# Patient Record
Sex: Female | Born: 1975 | Hispanic: Yes | Marital: Single | Smoking: Never smoker
Health system: Southern US, Community
[De-identification: ages and names within clinical notes are randomized; demographics above are authoritative.]

## PROBLEM LIST (undated history)

## (undated) DIAGNOSIS — D649 Anemia, unspecified: Secondary | ICD-10-CM

## (undated) DIAGNOSIS — E119 Type 2 diabetes mellitus without complications: Secondary | ICD-10-CM

## (undated) HISTORY — DX: Type 2 diabetes mellitus without complications: E11.9

## (undated) HISTORY — DX: Anemia, unspecified: D64.9

---

## 2009-10-24 ENCOUNTER — Emergency Department: Payer: Self-pay | Admitting: Emergency Medicine

## 2010-07-21 ENCOUNTER — Emergency Department: Payer: Self-pay | Admitting: Emergency Medicine

## 2012-05-28 IMAGING — US US OB < 14 WEEKS - US OB TV
1 series · 17 of 28 positions shown · non-contrast
Comparison: none

REASON FOR EXAM: vaginal bleeding pregnant
COMMENTS:   May transport without cardiac monitor

[Series 1: us ob < 14 weeks - us ob tv · 17 of 59 slices shown]
[im 1/59]
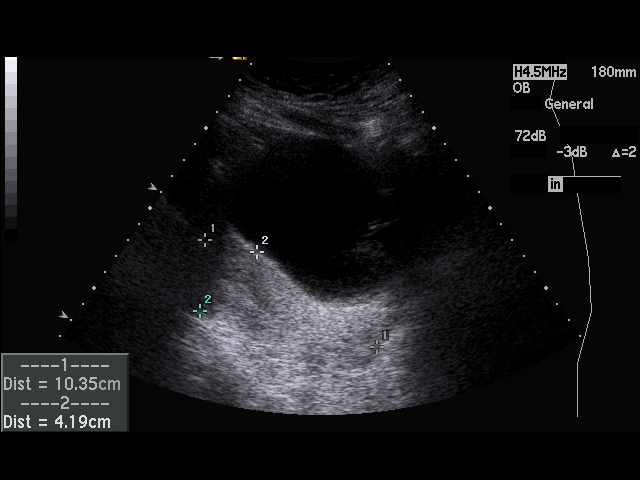
[im 5/59]
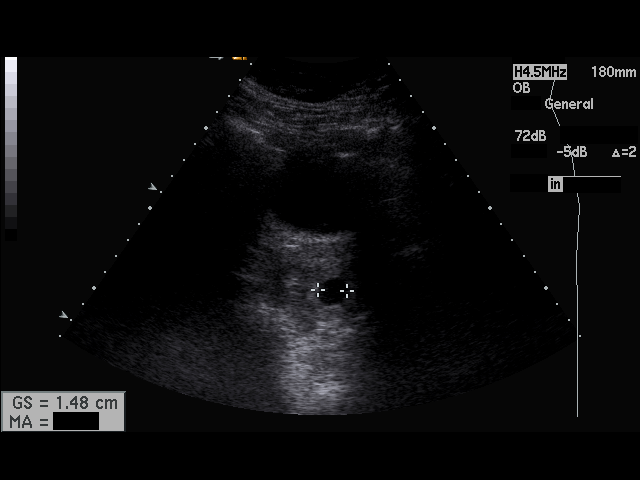
[im 9/59]
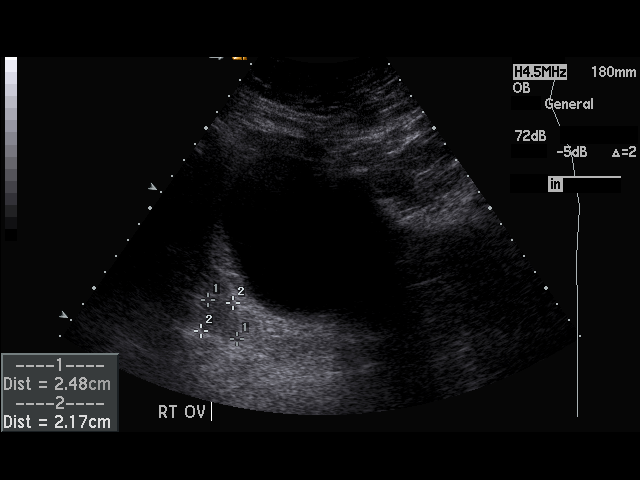
[im 11/59]
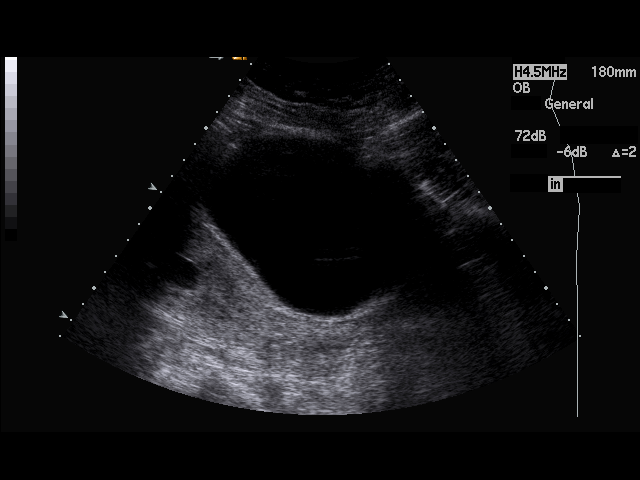
[im 16/59]
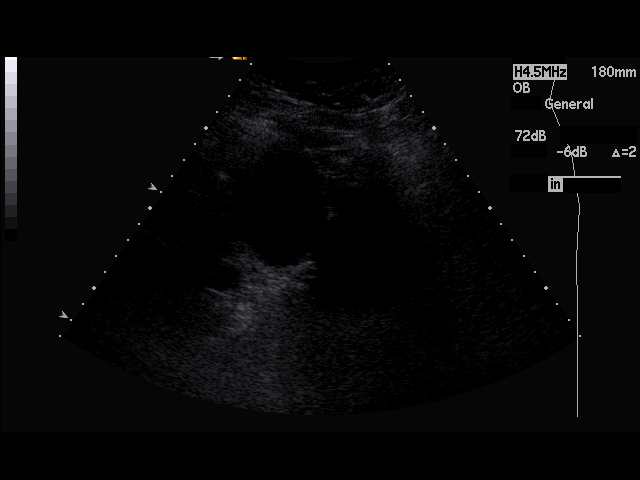
[im 20/59]
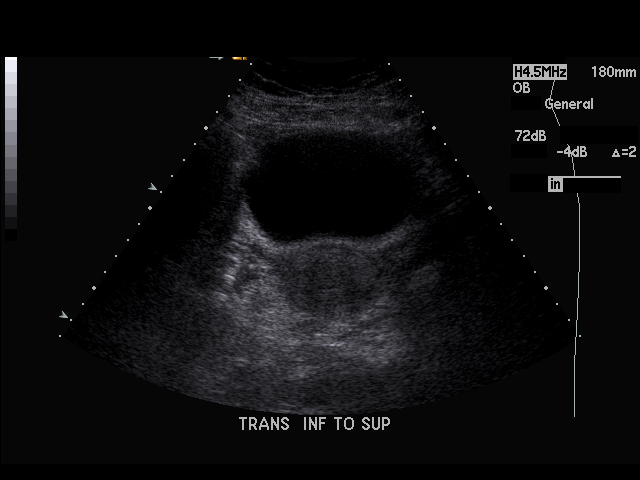
[im 22/59]
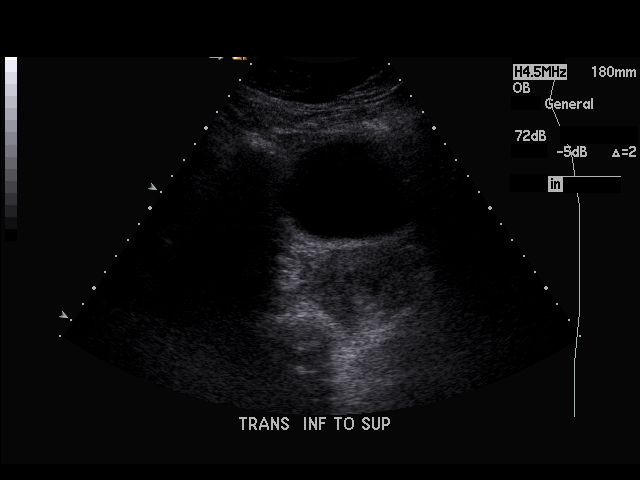
[im 26/59]
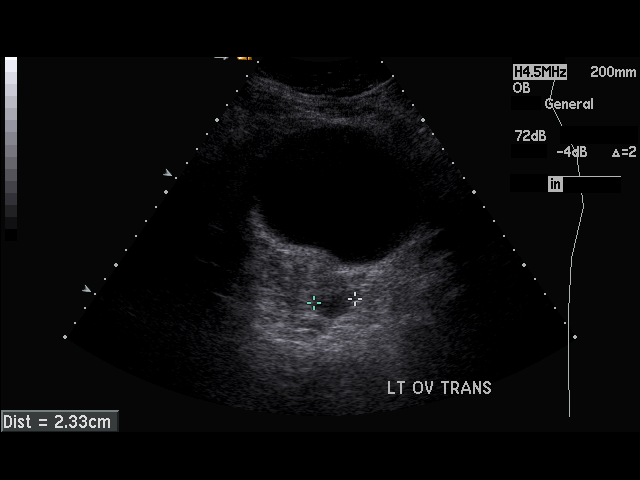
[im 31/59]
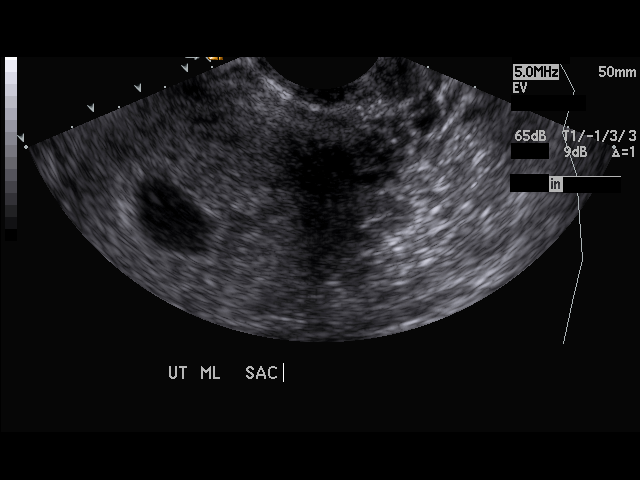
[im 33/59]
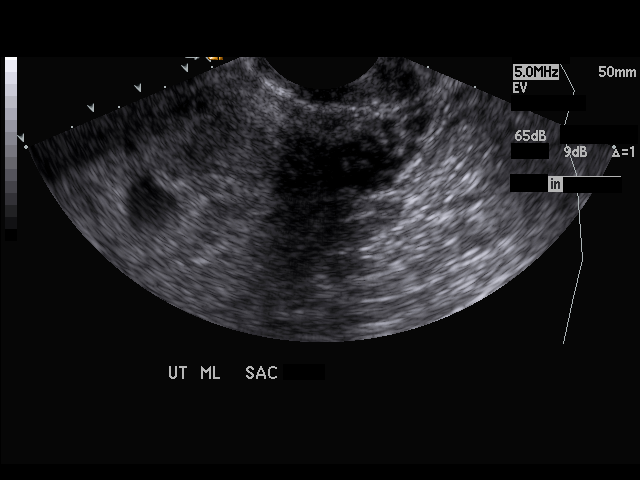
[im 37/59]
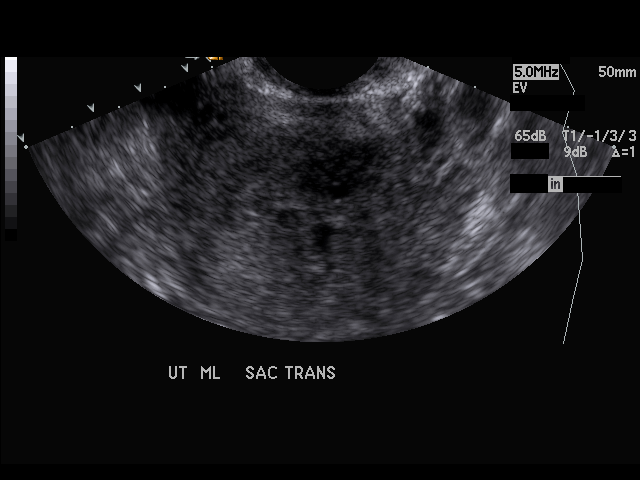
[im 39/59]
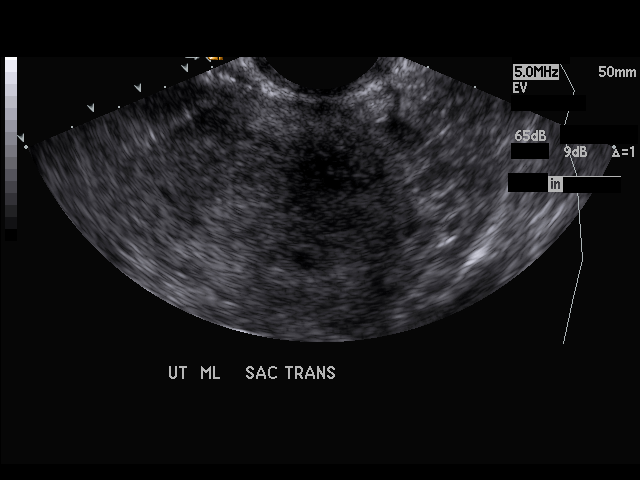
[im 43/59]
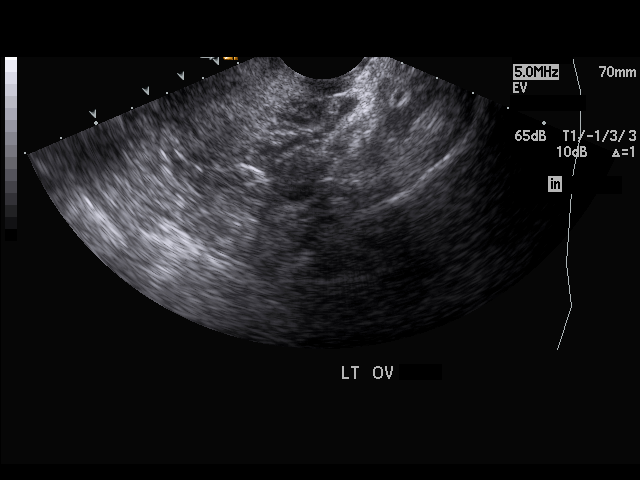
[im 48/59]
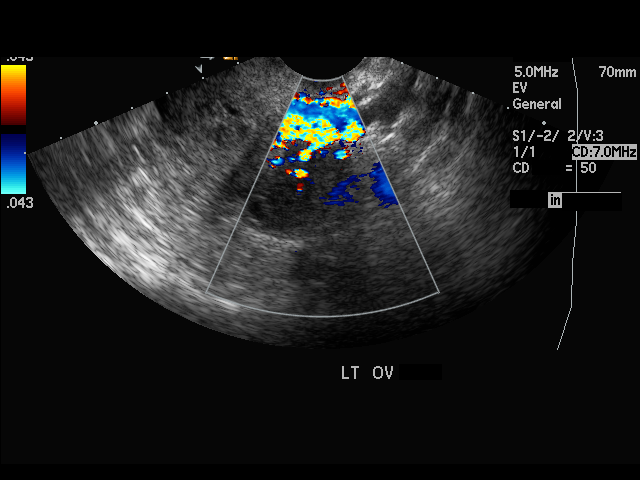
[im 50/59]
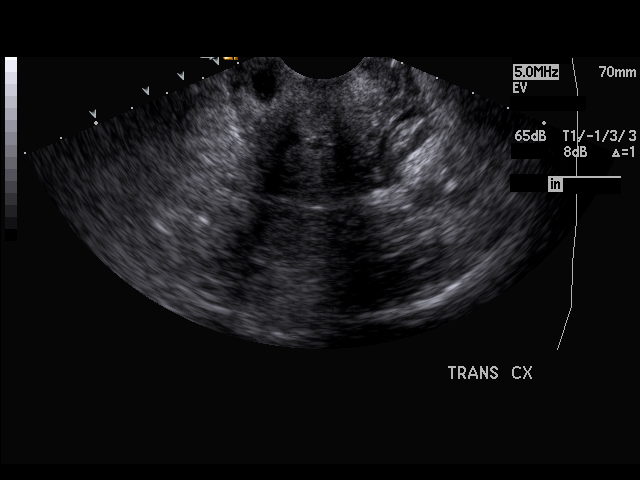
[im 54/59]
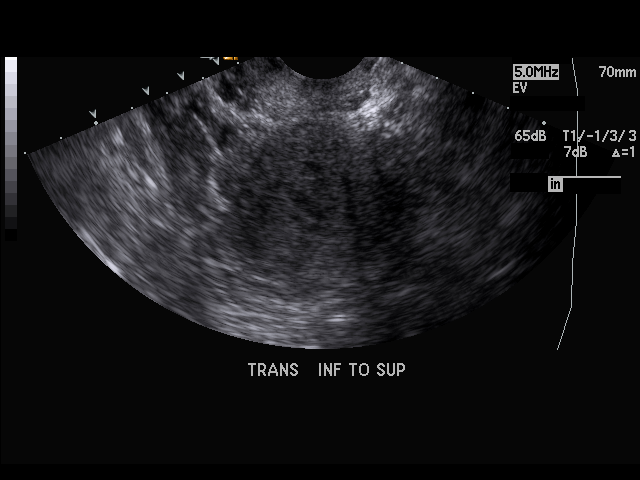
[im 59/59]
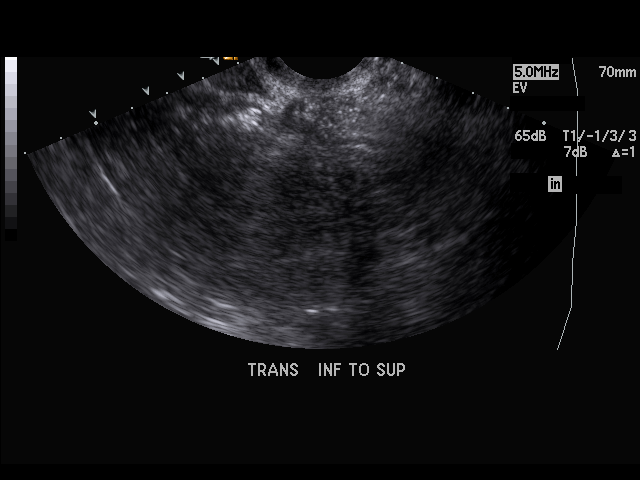

[17 of 28 positions shown; findings below may reference images not displayed]

PROCEDURE:     US  - US OB LESS THAN 14 WEEKS/W TRANS  - October 24, 2009 [DATE]

RESULT:     Emergent transabdominal and endovaginal pelvic sonogram is
performed. There is no previous study for comparison.

The uterus measures 10.35 x 4.19 cm due to transverse measurement was not
obtained. Both ovaries are seen. There is an intrauterine fluid collection
suggestive of a gestational sac. Gestational sac diameter is 1.48 cm
consistent with a 5 week 5 day gestation. No fetal pole is evident. This
could be secondary to being an early intrauterine gestation. A pseudosac
from an ectopic gestation or a nonviable gestation also considerations.
Routine followup is recommended with serial quantitative beta hCGs and
ultrasound as necessary. There is no free fluid or extrauterine abnormal
fluid collection. A Foley catheter is seen in the urinary bladder. The
ovaries appear unremarkable in size and echotexture.
IMPRESSION: 1. Intrauterine fluid collection as discussed above. Followup with serial
quantitative beta-hCG is recommended. No definite ectopic fluid collection
is identified but followup is required to exclude pseudogestational sac or
nonviable gestation. An early normal intrauterine gestation without a
visible fetal pole is also a consideration.

## 2013-02-22 IMAGING — US US PELV - US TRANSVAGINAL
1 series · 14 of 25 positions shown · non-contrast
Comparison: none

REASON FOR EXAM: vaginal bleeding, R adnexal tenderness
COMMENTS:

[Series 1: us pelv - us transvaginal · 0.39mm/px · 14 of 70 slices shown]
[im 1/70]
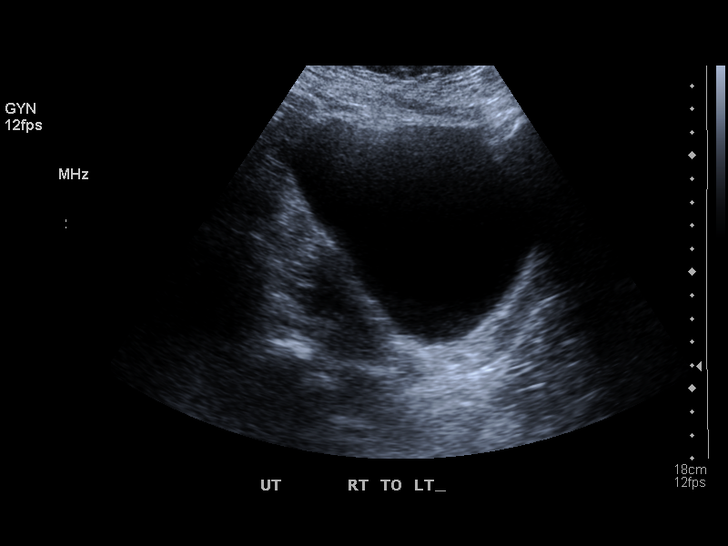
[im 6/70]
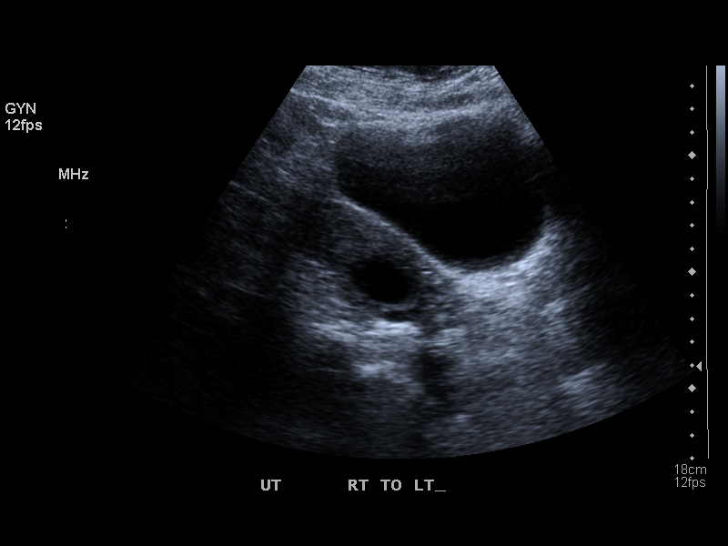
[im 12/70]
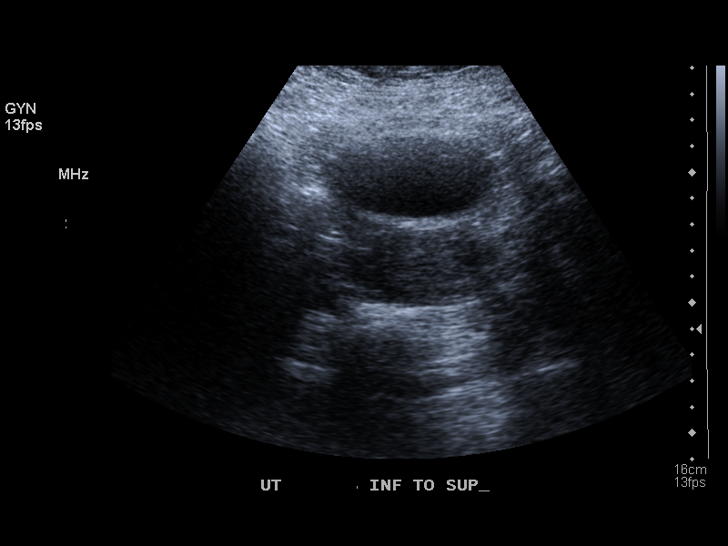
[im 18/70]
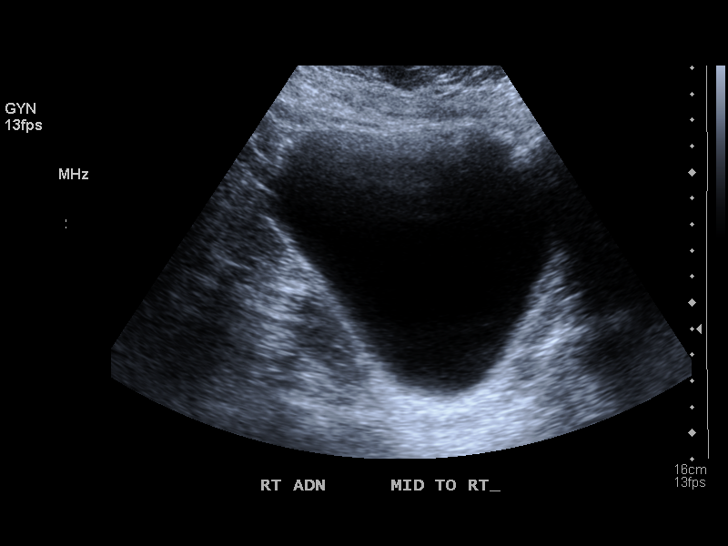
[im 24/70]
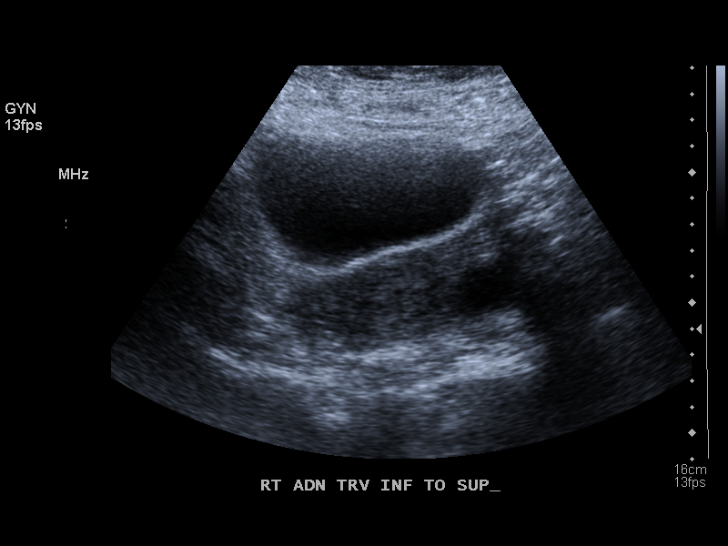
[im 26/70]
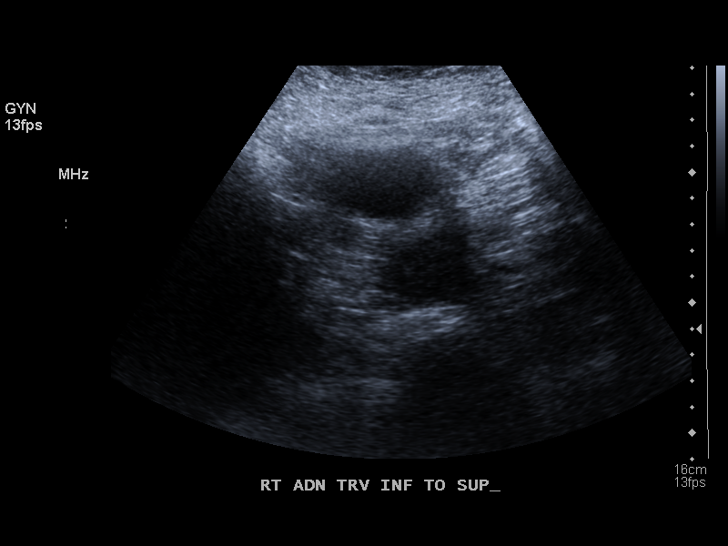
[im 32/70]
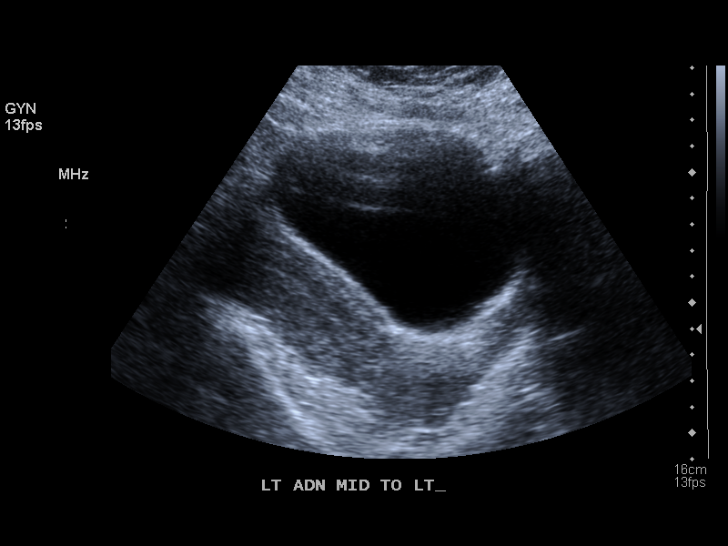
[im 38/70]
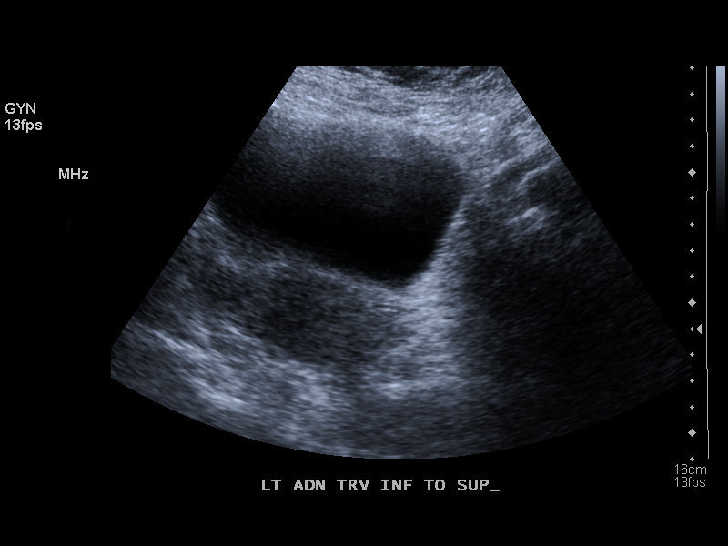
[im 44/70]
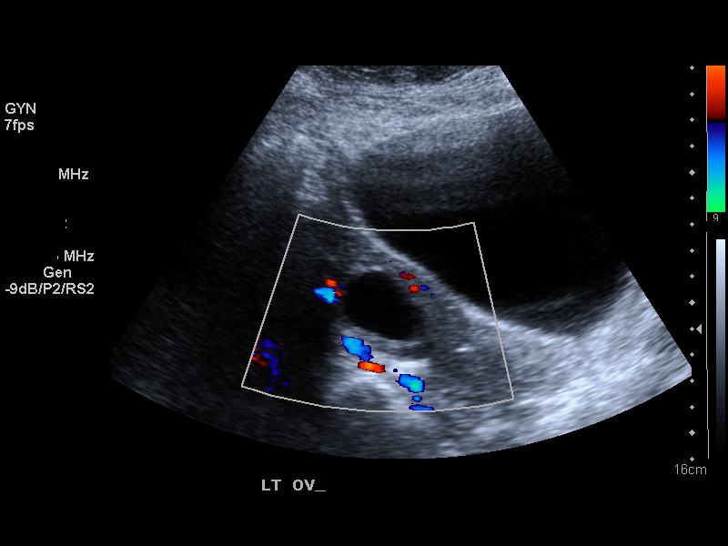
[im 47/70]
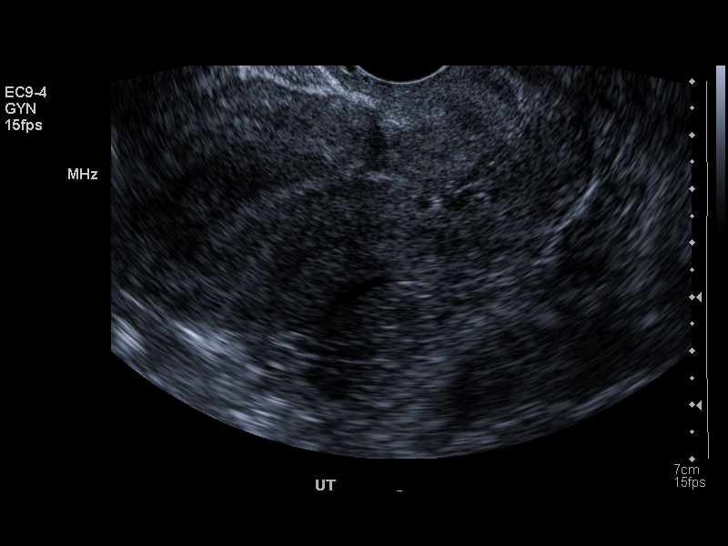
[im 52/70]
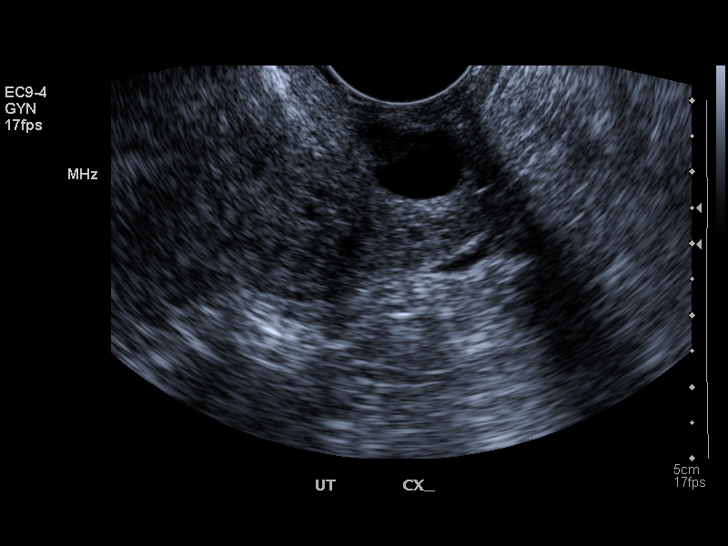
[im 58/70]
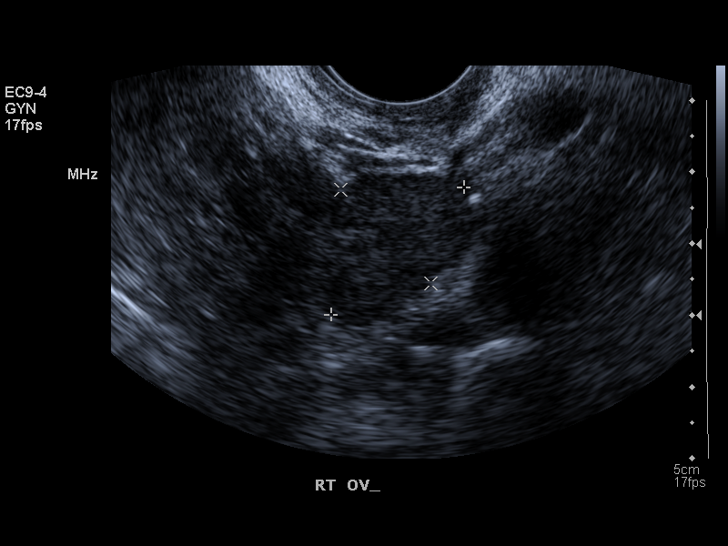
[im 64/70]
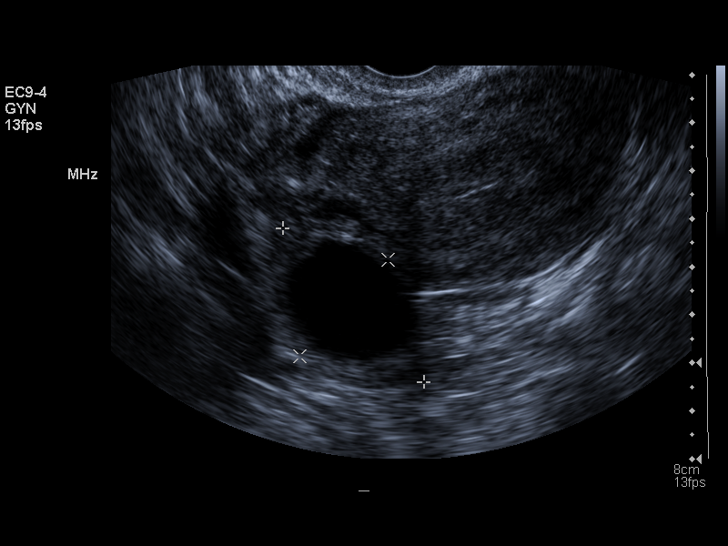
[im 70/70]
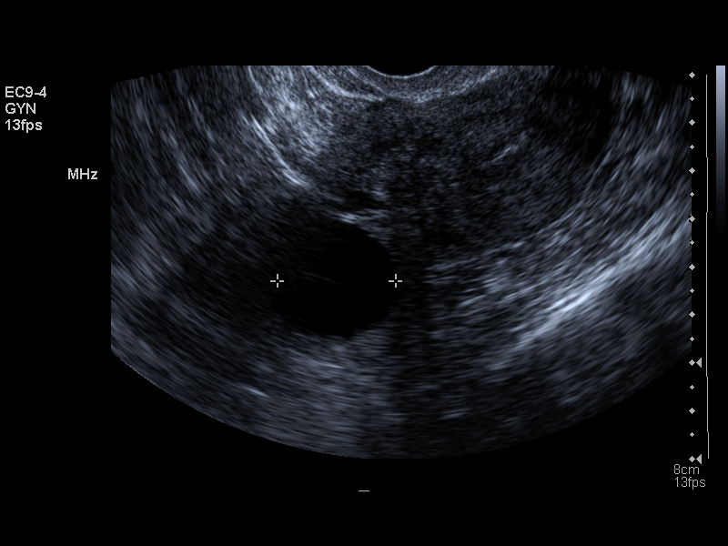

[14 of 25 positions shown; findings below may reference images not displayed]

PROCEDURE:     US  - US PELVIS EXAM W/TRANSVAGINAL  - July 21, 2010  [DATE]

RESULT:     Endovaginal and transabdominal pelvic sonogram demonstrates the
uterus is in the midline and measures 5.07 x 11.55 x 3.4 cm. Endovaginal
scanning is performed and demonstrates an endometrial stripe thickness of
6.6 mm. The adnexal structures appear to be unremarkable. The left ovary
contains a 2.7 x 2.2 x 2.5 cm cyst. A Nabothian cyst is seen measuring up to
1.35 cm. The right ovary is unremarkable.
IMPRESSION: 1.     Nabothian cyst and left ovarian cyst. Otherwise, grossly normal
study. There is a thin amount of fluid in the endometrium which is of
doubtful clinical significance.

## 2016-01-22 DIAGNOSIS — R899 Unspecified abnormal finding in specimens from other organs, systems and tissues: Secondary | ICD-10-CM | POA: Insufficient documentation

## 2016-08-29 LAB — HM HIV SCREENING LAB: HM HIV Screening: NEGATIVE

## 2016-08-29 LAB — HM PAP SMEAR: HM Pap smear: NEGATIVE

## 2019-03-26 ENCOUNTER — Other Ambulatory Visit: Payer: Self-pay

## 2019-03-26 ENCOUNTER — Encounter: Payer: Self-pay | Admitting: Emergency Medicine

## 2019-03-26 ENCOUNTER — Ambulatory Visit
Admission: EM | Admit: 2019-03-26 | Discharge: 2019-03-26 | Disposition: A | Discharge status: Home / Self Care | Payer: Commercial Managed Care - PPO | Attending: Urgent Care | Admitting: Urgent Care

## 2019-03-26 DIAGNOSIS — R0602 Shortness of breath: Secondary | ICD-10-CM | POA: Diagnosis not present

## 2019-03-26 DIAGNOSIS — R05 Cough: Secondary | ICD-10-CM

## 2019-03-26 DIAGNOSIS — Z20822 Contact with and (suspected) exposure to covid-19: Secondary | ICD-10-CM | POA: Diagnosis not present

## 2019-03-26 DIAGNOSIS — U071 COVID-19: Secondary | ICD-10-CM | POA: Insufficient documentation

## 2019-03-26 HISTORY — DX: COVID-19: U07.1

## 2019-03-26 MED ORDER — HYDROCOD POLST-CPM POLST ER 10-8 MG/5ML PO SUER: 5.0000 mL | Freq: Two times a day (BID) | ORAL | 0 refills | Status: AC | PRN

## 2019-03-26 NOTE — ED Triage Notes (Signed)
Patient triaged with video spanish interpreter Christiane Ha [phone]. Patient in today c/o cough and sob with coughing x 1 day. Patient does state that she had a headache and body aches.

## 2019-03-26 NOTE — Discharge Instructions (Addendum)
It was very nice seeing you today in clinic. Thank you for entrusting me with your care.  Rest and stay HYDRATED. Water, Gatorade/Pedialyte are best to prevent dehydration Recommend warm salt water gargles, hard candies/lozenges, and hot tea with honey/lemon to help soothe the throat and reduce irritation.  May use Tylenol and/or Ibuprofen as needed for pain/fever.  Cough medication as prescribed. Your prescription has been called in to your pharmacy.  You were tested for SARS-CoV-2 (novel coronavirus) today. Testing is performed by an outside lab ([facility]) and has variable turn around times ranging between 2-5 days. Current recommendations from the the CDC and [place] DHHS require that you remain out of work in order to quarantine at home until negative test results are have been received. In the event that your test results are positive, you will be contacted with further directives. These measures are being implemented out of an abundance of caution to prevent transmission and spread during the current SARS-CoV-2 pandemic.  Make arrangements to follow up with your regular doctor in 1 week for re-evaluation if not improving. If your symptoms/condition worsens, please seek follow up care either here or in the ER. Please remember, our [facility] providers are "right here with you" when you need [place].   Again, it was my pleasure to take care of you today. Thank you for choosing our clinic. I hope that you start to feel better quickly.   Quentin Mulling, MSN, APRN, FNP-C, CEN Advanced Practice Provider [facility]

## 2019-03-27 ENCOUNTER — Encounter: Payer: Self-pay | Admitting: Urgent Care

## 2019-03-27 NOTE — ED Provider Notes (Signed)
[place], [place]   Name: Alicia Edwards DOB: April 09, 1975 MRN: [id number] CSN: [id number] PCP: Patient, No Pcp Per  Arrival date and time:  03/26/19 1326  Chief Complaint:  Cough and Shortness of Breath   NOTE: Prior to seeing the patient today, I have reviewed the triage nursing documentation and vital signs. Clinical staff has updated patient's PMH/PSHx, current medication list, and drug allergies/intolerances to ensure comprehensive history available to assist in medical decision making.   History:    Patient is non-English speaking; speaks Spanish only. Encounter assisted by family member (son) and tele-interpreter Roderic Palau [phone]).   HPI: Alicia Edwards is a 44 y.o. female who presents today with complaints of fatigue, cough, congestion, shortness of breath, sore throat, diffuse myalgias, and a generalized headache that started approximately yesterday. Patient denies fevers. Cough has been non-productive. She complains of it being worse at night. Denies wheezing. Cough ahs been forceful causing her to experience pleuritic chest pain with coughing and deep inspiration. She denies that she has experienced any nausea, vomiting, diarrhea, or abdominal pain. She is eating and drinking well. Patient confirms perceived alterations to her sense of taste and smell; ageusia and anosmia. Patient denies being in close contact with anyone known to be ill; no one else is her home has experienced a similar symptom constellation. She has never been tested for SARS-CoV-2 (novel coronavirus) in the past per her report. Patient has not been vaccinated for influenza this season. In efforts to conservatively manage her symptoms at home, the patient notes that she has used APAP, which has not helped to improve her symptoms.  History reviewed. No pertinent past medical history.  Past Surgical History:  Procedure Laterality Date  . CESAREAN SECTION      Family History  Problem  Relation Age of Onset  . Diabetes Mother   . Diabetes Father     Social History   Tobacco Use  . Smoking status: Never Smoker  . Smokeless tobacco: Never Used  Substance Use Topics  . Alcohol use: Never  . Drug use: Never    There are no problems to display for this patient.   Home Medications:    No outpatient medications have been marked as taking for the 03/26/19 encounter [facility] Encounter).    Allergies:   Patient has no known allergies.  Review of Systems (ROS): Review of Systems  Constitutional: Positive for fatigue. Negative for fever.  HENT: Positive for postnasal drip and sore throat. Negative for congestion, ear pain, rhinorrhea, sinus pressure, sinus pain and sneezing.        (+) anosmia and ageusia.  Eyes: Negative for pain, discharge and redness.  Respiratory: Positive for cough and shortness of breath. Negative for chest tightness.   Cardiovascular: Positive for chest pain (pleuritic). Negative for palpitations.  Gastrointestinal: Negative for abdominal pain, diarrhea, nausea and vomiting.  Musculoskeletal: Positive for myalgias. Negative for arthralgias, back pain and neck pain.  Skin: Negative for color change, pallor and rash.  Neurological: Positive for headaches. Negative for dizziness, syncope and weakness.  Hematological: Negative for adenopathy.  Psychiatric/Behavioral: Positive for sleep disturbance (2/2 cough).     Vital Signs: Today's Vitals   03/26/19 1338 03/26/19 1339 03/26/19 1421  BP: 135/86    Pulse: 81    Resp: 18    Temp: 98.7 F (37.1 C)    TempSrc: Oral    SpO2: 100%    Weight:  216 lb (98 kg)   Height:  5\' 3"  (1.6 m)  PainSc:  7  7     Physical Exam: Physical Exam  Constitutional: She is oriented to person, place, and time and well-developed, well-nourished, and in no distress.  Acutely ill appearing; fatigued/listless.  HENT:  Head: Normocephalic and atraumatic.  Nose: Rhinorrhea present. No sinus tenderness.    Mouth/Throat: Uvula is midline and mucous membranes are normal. Posterior oropharyngeal erythema (+) clear PND present. No oropharyngeal exudate or posterior oropharyngeal edema.  Eyes: Pupils are equal, round, and reactive to light.  Cardiovascular: Normal rate, regular rhythm, normal heart sounds and intact distal pulses.  Pulmonary/Chest: Effort normal and breath sounds normal. She exhibits no tenderness, no crepitus and no retraction.  Moderate cough noted in clinic. No SOB or increased WOB. No distress. Able to speak in complete sentences without difficulties. SPO2 100% on RA  Neurological: She is alert and oriented to person, place, and time. Gait normal.  Skin: Skin is warm and dry. No rash noted. She is not diaphoretic.  Psychiatric: Mood, memory, affect and judgment normal.  Nursing note and vitals reviewed.   Urgent Care Treatments / Results:   Orders Placed This Encounter  Procedures  . Novel Coronavirus, NAA (Hosp order, Send-out to Ref Lab; TAT 18-24 hrs    LABS: PLEASE NOTE: all labs that were ordered this encounter are listed, however only abnormal results are displayed. Labs Reviewed  NOVEL CORONAVIRUS, NAA (HOSP ORDER, SEND-OUT TO REF LAB; TAT 18-24 HRS) - Abnormal; Notable for the following components:      Result Value   SARS-CoV-2, NAA DETECTED (*)    All other components within normal limits    EKG: -None  RADIOLOGY: No results found.  PROCEDURES: Procedures  MEDICATIONS RECEIVED THIS VISIT: Medications - No data to display  PERTINENT CLINICAL COURSE NOTES/UPDATES:   Initial Impression / Assessment and Plan / Urgent Care Course:  Pertinent labs & imaging results that were available during my care of the patient were personally reviewed by me and considered in my medical decision making (see lab/imaging section of note for values and interpretations).  Alicia Edwards is a 44 y.o. female who presents to [facility] today with  complaints of Cough and Shortness of Breath  Patient acutely ill appearing (non-toxic) appearing in clinic today. She does not appear to be in any acute distress. Presenting symptoms (see HPI) and exam as documented above. She presents with symptoms associated with SARS-CoV-2 (novel coronavirus). Discussed typical symptom constellation. Reviewed potential for infection and need for testing. Patient amenable to being tested. SARS-CoV-2 swab collected by certified clinical staff. Discussed variable turn around times associated with testing, as swabs are being processed at [facility], and have been taking between 24-48 hours to come back. She was advised to self quarantine, per [facility] [place] DHHS guidelines, until negative results received. These measures are being implemented out of an abundance of caution to prevent transmission and spread during the current SARS-CoV-2 pandemic.  Presenting symptoms and physical exam highly suspicious for SARS-CoV-2. Discussed that until ruled out with confirmatory lab testing, SARS-CoV-2 remains at the top of the differential diagnosis list. Her testing is pending at this time. Patient has co-morbidities that increase her SARS-CoV-2 morbidity and mortality risk including Body mass index is 38.26 kg/m. If patient found to be positive for SARS-CoV-2, her co-morbidities should qualify him for the monoclonal antibody (bamlanivimab). Outpatient COVID response team to determine eligibility and contact the patient to discuss if she is deemed to be eligible for the infusion treatment. Discussed with her that  her symptoms are felt to be viral in nature, thus antibiotics would not offer her any relief or improve his symptoms any faster than conservative symptomatic management. Discussed supportive care measures at home during acute phase of illness. Patient to rest as much as possible. She was encouraged to ensure adequate hydration (water and ORS) to prevent dehydration and electrolyte derangements.  Patient may use APAP and/or IBU on an as needed basis for pain/fever. Cough is significant and preventing sleep. Will send in a supply of Tussionex for PRN use. She was educated on the indications and associated side effects of this medication.   Current clinical condition warrants patient being out of work in order to quarantine while waiting for testing results. She was provided with the appropriate documentation to provide to her place of employment that will allow for her to RTW on 03/29/19 with no restrictions. RTW is contingent on her SARS-CoV-2 test results being reviewed as negative.     Discussed follow up with primary care physician in 1 week for re-evaluation. I have reviewed the follow up and strict return precautions for any new or worsening symptoms. Patient is aware of symptoms that would be deemed urgent/emergent, and would thus require further evaluation either here or in the emergency department. At the time of discharge, she verbalized understanding and consent with the discharge plan as it was reviewed with her. All questions were fielded by provider and/or clinic staff prior to patient discharge.    Final Clinical Impressions / Urgent Care Diagnoses:   Final diagnoses:  Suspected COVID-19 virus infection  Encounter for laboratory testing for COVID-19 virus    New Prescriptions:  [place] Controlled Substance Registry consulted? Yes, I have consulted the Rule Controlled Substances Registry for this patient, and feel the risk/benefit ratio today is favorable for proceeding with this prescription for a controlled substance.  . Discussed use of controlled substance medication to treat her acute symptoms.  o Reviewed [place] STOP Act regulations  o Clinic does not refill controlled substances over the phone without face to face evaluation.  . Safety precautions reviewed.  o Medications should not be sold or taken with alcohol.  o Avoid use while working, driving, or operating heavy machinery.   o Side effects associated with the use of this particular medication reviewed. - Patient understands that this medication can cause CNS depression, increase her risk of falls, and even lead to overdose that may result in death, if used outside of the parameters that she and I discussed.  With all of this in mind, she knowingly accepts the risks and responsibilities associated with intended course of treatment, and elects to responsibly proceed as discussed.  Meds ordered this encounter  Medications  . chlorpheniramine-HYDROcodone (TUSSIONEX PENNKINETIC ER) 10-8 MG/5ML SUER    Sig: Take 5 mLs by mouth every 12 (twelve) hours as needed for cough. Will causes drowsiness; NO DRIVING.    Dispense:  50 mL    Refill:  0    Recommended Follow up Care:  Patient encouraged to follow up with the following provider within the specified time frame, or sooner as dictated by the severity of her symptoms. As always, she was instructed that for any urgent/emergent care needs, she should seek care either here or in the emergency department for more immediate evaluation.  Follow-up Information    PCP In 1 week.   Why: General reassessment of symptoms if not improving        NOTE: This note was prepared using Dragon  dictation software along with smaller phrase technology. Despite my best ability to proofread, there is the potential that transcriptional errors may still occur from this process, and are completely unintentional.    Verlee Monte, NP 03/27/19 1810

## 2019-03-28 ENCOUNTER — Telehealth: Payer: Self-pay

## 2019-03-28 ENCOUNTER — Telehealth: Payer: Self-pay | Admitting: Nurse Practitioner

## 2019-03-28 LAB — NOVEL CORONAVIRUS, NAA (HOSP ORDER, SEND-OUT TO REF LAB; TAT 18-24 HRS): SARS-CoV-2, NAA: DETECTED — AB

## 2019-03-28 NOTE — Telephone Encounter (Signed)
Attempted to call and discuss with Lovette Cliche about Covid symptoms and the use of bamlanivimab, a monoclonal antibody infusion for those with mild to moderate Covid symptoms and at a high risk of hospitalization.  Voicemail was left.   Upon chart reviewed pt is not qualified for this infusion due to lack of identified risk factors and co-morbid conditions.    There are no problems to display for this patient.  Willette Alma, AGPCNP-BC Pager: [phone] Amion: Thea Alken

## 2019-03-28 NOTE — Telephone Encounter (Signed)
Pt. Calling for COVID 19 results. Using Spanish interpreter [place], # [phone], given positive result. Will quarantine x 10 days from beginning of symptoms. Reviewed home safety and when to seek medical attention. Health dept. Notified.

## 2019-04-08 ENCOUNTER — Telehealth: Payer: Self-pay

## 2019-04-08 NOTE — Telephone Encounter (Signed)
Patient called using interpreter, Alicia Edwards ID# [phone]. Message left to return call. Patient will need to activate MyChart account in order to received letter with COVID guidelines.

## 2019-04-08 NOTE — Telephone Encounter (Signed)
Patient would like to have a letter stating the covid guidelines for positive result to [employer]

## 2019-11-13 DIAGNOSIS — R899 Unspecified abnormal finding in specimens from other organs, systems and tissues: Secondary | ICD-10-CM

## 2019-11-14 ENCOUNTER — Ambulatory Visit (LOCAL_COMMUNITY_HEALTH_CENTER): Payer: Commercial Managed Care - PPO | Admitting: Family Medicine

## 2019-11-14 ENCOUNTER — Encounter: Payer: Self-pay | Admitting: Family Medicine

## 2019-11-14 ENCOUNTER — Other Ambulatory Visit: Payer: Self-pay

## 2019-11-14 VITALS — BP 135/83 | Ht 61.5 in | Wt 208.2 lb

## 2019-11-14 DIAGNOSIS — Z3009 Encounter for other general counseling and advice on contraception: Secondary | ICD-10-CM

## 2019-11-14 DIAGNOSIS — E669 Obesity, unspecified: Secondary | ICD-10-CM

## 2019-11-14 DIAGNOSIS — Z113 Encounter for screening for infections with a predominantly sexual mode of transmission: Secondary | ICD-10-CM

## 2019-11-14 DIAGNOSIS — Z3200 Encounter for pregnancy test, result unknown: Secondary | ICD-10-CM | POA: Diagnosis not present

## 2019-11-14 LAB — PREGNANCY, URINE: Preg Test, Ur: NEGATIVE

## 2019-11-14 NOTE — Progress Notes (Signed)
Here today for PE and Pregnancy Test. Pregnancy Test negative. Last PE and Pap Smear was 08/29/2016. Unsure if she wants birth control but would like to discuss. Wants all STD screening today. Tawny Hopping, RN

## 2019-11-14 NOTE — Progress Notes (Signed)
Family Planning Visit- Repeat Yearly Visit  Subjective:  Alicia Edwards is a 44 y.o. [phone]  being seen today for an well woman visit and to discuss family planning options.    She is currently using Condoms for pregnancy prevention. Patient reports her partner desires a pregnancy  in the next year.  Patient  has Gestational diabetes and Abnormal laboratory test on their problem list.  Chief Complaint  Patient presents with  . Contraception    Physical exam    Patient reports that she is here for her annual exam.  She feels that she is pregnant d/t her abdomen had grown.  States that she had a + then a - PT 1-2 months ago.    She states that her periods became irregular since removal of Paragard IUD 4 years ago.  She is not sure she wants to be pregnant but partner has not children and he would like a baby.  Client states that she has headaches w/o aura, fatigue and not sleeping well.  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  She may sleep 4-5 hour/day.  She has her 73 yo daughter living at home with her and tries to sleep while she is at school.  Client states that her mother took her BS and the results were >300.  She had gestational diabetes last pregnancy and both parents have T2DM.  Client admits that she has had abnormal BS results in the past, hasn't established a PCP.  Patient denies other concerns. See flowsheet for other program required questions.   Body mass index is 38.7 kg/m. - Patient is eligible for diabetes screening based on BMI and age >28?  yes HA1C ordered? yes  Patient reports 1 of partners in last year. Desires STI screening?  Yes   Has patient been screened once for HCV in the past?  Yes  No results found for: HCVAB  Does the patient have current of drug use, have a partner with drug use, and/or has been incarcerated since last result? No  If yes-- Screen for HCV through [facility]   Does the patient meet  criteria for HBV testing? No  Criteria:  -Household, sexual or needle sharing contact with HBV -History of drug use -HIV positive -Those with known Hep C   Health Maintenance Due  Topic Date Due  . Hepatitis C Screening  Never done  . COVID-19 Vaccine (1) Never done  . INFLUENZA VACCINE  Never done    Review of Systems  Neurological: Positive for headaches.       Headache- works 3rd shift, not sleeping well, may sleep 4-5 hours/day x 6 days, has Sunday off.  Has HA w/o aura 1-2 times/week take tylenol or ibuprofen with some relief.    All other systems reviewed and are negative.   The following portions of the patient's history were reviewed and updated as appropriate: allergies, current medications, past family history, past medical history, past social history, past surgical history and problem list. Problem list updated.  Objective:   Vitals:   11/14/19 1101  BP: 135/83  Weight: 208 lb 3.2 oz (94.4 kg)  Height: 5' 1.5" (1.562 m)    Physical Exam Constitutional:      Appearance: She is obese.  Cardiovascular:     Rate and Rhythm: Regular rhythm.  Pulmonary:     Effort: Pulmonary effort is normal.  Abdominal:     General: There is no distension.  Palpations: Abdomen is soft. There is mass.     Tenderness: There is no abdominal tenderness. There is no rebound.     Hernia: There is no hernia in the left inguinal area or right inguinal area.     Comments: Mid upper right quad-  ~3x3cm mobile, firm mass.  No redness, tenderness noted.  Genitourinary:    General: Normal vulva.     Labia:        Right: No rash, tenderness, lesion or injury.        Left: No rash, tenderness, lesion or injury.      Urethra: No prolapse.     Vagina: No vaginal discharge.     Cervix: Normal.     Uterus: Normal.      Adnexa: Right adnexa normal and left adnexa normal.  Musculoskeletal:     Cervical back: Neck supple.  Lymphadenopathy:     Cervical: No cervical adenopathy.  Skin:     General: Skin is warm and dry.     Findings: No lesion or rash.  Neurological:     Mental Status: She is alert and oriented to person, place, and time.       Assessment and Plan:  Alicia Edwards is a 44 y.o. female [phone] presenting to the [facility] Department for an yearly well woman exam/family planning visit  Contraception counseling: Reviewed all forms of birth control options in the tiered based approach. available including abstinence; over the counter/barrier methods; hormonal contraceptive medication including pill, patch, ring, injection,contraceptive implant, ECP; hormonal and nonhormonal IUDs; permanent sterilization options including vasectomy and the various tubal sterilization modalities. Risks, benefits, and typical effectiveness rates were reviewed.  Questions were answered.  Written information was also given to the patient to review.  Patient desires to use condoms, these were given to patient. She will follow up in 1 year or PRN for surveillance.  She was told to call with any further questions, or with any concerns about this method of contraception.  Emphasized use of condoms 100% of the time for STI prevention.  Patient was offered ECP. ECP was not accepted by the patient. ECP counseling was not given - see RN documentation   1. Encounter for pregnancy test, result unknown - Pregnancy, urine   2. Family planning -Hgb A1C w/o eAG -Co. Client to use condoms always. Discussed to delay a pregnancy until her diabetes dx is confirmed and she receives care. Encouraged her establish a PCP to manage her health concerns- headaches, diabetes and R abd. mass.  Client states that she'll make an appointment .  Discussed how uncontrolled diabetes may affect her health and vital organs.     3. Screening examination for venereal disease - HIV/HCV [place] [facility] - Syphilis Serology, [place] [facility] - Chlamydia/Gonorrhea [place] [facility]  4. Obesity (BMI  30-39.9) Co. On healthy food options and daily exercise.   Encouraged to seek evaluation for diabetes management.    No follow-ups on file.  No future appointments.  Larene Pickett, FNP

## 2019-11-15 LAB — HGB A1C W/O EAG: Hgb A1c MFr Bld: 13.7 % — ABNORMAL HIGH (ref 4.8–5.6)

## 2019-11-19 ENCOUNTER — Telehealth: Payer: Self-pay

## 2019-11-19 NOTE — Telephone Encounter (Signed)
Phone call to patient to inform of abnormal Hgb A1c lab. Informed patient that lab results indicates Diabetes diagnosis. Patient asking what the normal lab values are for this lab. RN explained above and stressed importance of patient establishing care ASAP with a PCP for management of Diabetes. Patient verbalizes understanding and states will call [facility] today to schedule an appt. Regarding above information. M. Yemen assisted with interpretation. Tawny Hopping, RN

## 2019-11-21 ENCOUNTER — Encounter: Payer: Self-pay | Admitting: Family Medicine

## 2019-11-21 LAB — HM HIV SCREENING LAB: HM HIV Screening: NEGATIVE

## 2019-11-21 LAB — HM HEPATITIS C SCREENING LAB: HM Hepatitis Screen: NEGATIVE

## 2021-01-04 ENCOUNTER — Other Ambulatory Visit (HOSPITAL_COMMUNITY)
Admission: RE | Admit: 2021-01-04 | Discharge: 2021-01-04 | Disposition: A | Discharge status: Home / Self Care | Payer: Commercial Managed Care - PPO | Source: Ambulatory Visit | Attending: Obstetrics and Gynecology | Admitting: Obstetrics and Gynecology

## 2021-01-04 ENCOUNTER — Ambulatory Visit (INDEPENDENT_AMBULATORY_CARE_PROVIDER_SITE_OTHER): Payer: Commercial Managed Care - PPO | Admitting: Obstetrics and Gynecology

## 2021-01-04 ENCOUNTER — Other Ambulatory Visit: Payer: Self-pay

## 2021-01-04 ENCOUNTER — Encounter: Payer: Self-pay | Admitting: Obstetrics and Gynecology

## 2021-01-04 VITALS — BP 126/84 | Wt 212.0 lb

## 2021-01-04 DIAGNOSIS — R87610 Atypical squamous cells of undetermined significance on cytologic smear of cervix (ASC-US): Secondary | ICD-10-CM | POA: Insufficient documentation

## 2021-01-04 DIAGNOSIS — R87810 Cervical high risk human papillomavirus (HPV) DNA test positive: Secondary | ICD-10-CM | POA: Insufficient documentation

## 2021-01-04 NOTE — Progress Notes (Addendum)
Referring Provider:  Beverely Low, MD, from [facility]  HPI:  Alicia Edwards is a 45 y.o.  [id number]  who presents today for evaluation and management of abnormal cervical cytology.    Dysplasia History:  ASCUS, HPV+  OB History  Gravida Para Term Preterm AB Living  6 5 5  0 1 5  SAB IAB Ectopic Multiple Live Births  0 1 0 0 5    # Outcome Date GA Lbr Len/2nd Weight Sex Delivery Anes PTL Lv  6 IAB           5 Term           4 Term           3 Term           2 Term           1 Term             Past Medical History:  Diagnosis Date   Anemia    COVID-19 03/26/2019   PCR testing confirmed; resulted (+) 03/27/2019   Diabetes mellitus without complication (HCC)    Gestational    Past Surgical History:  Procedure Laterality Date   CESAREAN SECTION     2003    SOCIAL HISTORY:  Social History   Substance and Sexual Activity  Alcohol Use Never    Social History   Substance and Sexual Activity  Drug Use Never     Family History  Problem Relation Age of Onset   Diabetes Mother    Diabetes Father     ALLERGIES:  Patient has no known allergies.  Current Outpatient Medications on File Prior to Visit  Medication Sig Dispense Refill   chlorpheniramine-HYDROcodone (TUSSIONEX PENNKINETIC ER) 10-8 MG/5ML SUER Take 5 mLs by mouth every 12 (twelve) hours as needed for cough. Will causes drowsiness; NO DRIVING. (Patient not taking: No sig reported) 50 mL 0   metFORMIN (GLUCOPHAGE) 1000 MG tablet Take 1,000 mg by mouth 2 (two) times daily.     No current facility-administered medications on file prior to visit.    Physical Exam: -Vitals:  BP 126/84   Wt 212 lb (96.2 kg)   BMI 39.41 kg/m  GEN: WD, WN, NAD.  A+ O x 3, good mood and affect. ABD:  NT, ND.  Soft, no masses.  No hernias noted.   Pelvic:   Vulva: Normal appearance.  No lesions.  Vagina: No lesions or abnormalities noted.  Support: Normal pelvic support.  Urethra No  masses tenderness or scarring.  Meatus Normal size without lesions or prolapse.  Cervix: See below.  Anus: Normal exam.  No lesions.  Perineum: Normal exam.  No lesions.        Bimanual   Uterus: Normal size.  Non-tender.  Mobile.  AV.  Adnexae: No masses.  Non-tender to palpation.  Cul-de-sac: Negative for abnormality.   PROCEDURE: 1.  Urine Pregnancy Test:  negative 2.  Colposcopy performed with 4% acetic acid after verbal consent obtained                                         -Aceto-white Lesions Location(s): mostly anteriorly.              -Biopsy performed at 10, 12, 2, and 6 o'clock               -ECC  indicated and performed: Yes.       -Biopsy sites made hemostatic with pressure, AgNO3, and/or Monsel's solution   -Satisfactory colposcopy: No.    -Evidence of Invasive cervical CA :  NO  ASSESSMENT:  Alicia Edwards is a 45 y.o. [id number] here for  1. Atypical squamous cell changes of undetermined significance (ASCUS) on cervical cytology with positive high risk human papilloma virus (HPV)   .  PLAN: I discussed the grading system of pap smears and HPV high risk viral types.  We will discuss and base management after colpo results return.     Thomasene Mohair, MD  [facility], [facility] 01/04/2021  2:26 PM   CC: Abram Sander, MD [address] [place],  [place] [address]

## 2021-01-06 LAB — SURGICAL PATHOLOGY

## 2021-01-08 ENCOUNTER — Telehealth: Payer: Self-pay

## 2021-01-08 NOTE — Telephone Encounter (Signed)
-----   Message from Conard Novak, MD sent at 01/08/2021  1:58 PM EDT ----- Regarding: please call patient Please let patient know that she had only mild abnormalities on her biopsies. I recommend she have a 1-year follow up for a pap smear. Please document your phone call with her. You will need an interpreter.   ----- Message ----- From: Interface, Lab In [facility] Sent: 01/06/2021  12:53 PM EDT To: Conard Novak, MD

## 2021-01-08 NOTE — Telephone Encounter (Signed)
Please let me or Hoover Brunette know when pt calls back (she is spanish speaking)

## 2021-06-22 ENCOUNTER — Encounter: Payer: Self-pay | Admitting: Family Medicine

## 2022-06-19 DIAGNOSIS — Z7902 Long term (current) use of antithrombotics/antiplatelets: Secondary | ICD-10-CM | POA: Diagnosis not present

## 2022-06-19 DIAGNOSIS — M25561 Pain in right knee: Secondary | ICD-10-CM | POA: Diagnosis not present

## 2022-06-19 DIAGNOSIS — Z6839 Body mass index (BMI) 39.0-39.9, adult: Secondary | ICD-10-CM | POA: Diagnosis not present

## 2022-06-19 DIAGNOSIS — Z794 Long term (current) use of insulin: Secondary | ICD-10-CM | POA: Diagnosis not present

## 2022-06-19 DIAGNOSIS — E119 Type 2 diabetes mellitus without complications: Secondary | ICD-10-CM | POA: Diagnosis not present

## 2022-06-19 DIAGNOSIS — I10 Essential (primary) hypertension: Secondary | ICD-10-CM | POA: Diagnosis not present

## 2022-06-19 DIAGNOSIS — E669 Obesity, unspecified: Secondary | ICD-10-CM | POA: Diagnosis not present

## 2022-06-19 DIAGNOSIS — Z79899 Other long term (current) drug therapy: Secondary | ICD-10-CM | POA: Diagnosis not present

## 2022-06-26 DIAGNOSIS — M25562 Pain in left knee: Secondary | ICD-10-CM | POA: Diagnosis not present

## 2022-06-26 DIAGNOSIS — M25511 Pain in right shoulder: Secondary | ICD-10-CM | POA: Diagnosis not present

## 2022-06-26 DIAGNOSIS — M25512 Pain in left shoulder: Secondary | ICD-10-CM | POA: Diagnosis not present

## 2022-06-26 DIAGNOSIS — M25561 Pain in right knee: Secondary | ICD-10-CM | POA: Diagnosis not present

## 2022-07-15 DIAGNOSIS — Z712 Person consulting for explanation of examination or test findings: Secondary | ICD-10-CM | POA: Diagnosis not present

## 2022-07-15 DIAGNOSIS — M25569 Pain in unspecified knee: Secondary | ICD-10-CM | POA: Diagnosis not present

## 2022-07-15 DIAGNOSIS — E1165 Type 2 diabetes mellitus with hyperglycemia: Secondary | ICD-10-CM | POA: Diagnosis not present

## 2022-07-15 DIAGNOSIS — Z1389 Encounter for screening for other disorder: Secondary | ICD-10-CM | POA: Diagnosis not present

## 2022-07-15 DIAGNOSIS — I10 Essential (primary) hypertension: Secondary | ICD-10-CM | POA: Diagnosis not present

## 2022-07-15 DIAGNOSIS — E785 Hyperlipidemia, unspecified: Secondary | ICD-10-CM | POA: Diagnosis not present

## 2022-07-15 DIAGNOSIS — Z0131 Encounter for examination of blood pressure with abnormal findings: Secondary | ICD-10-CM | POA: Diagnosis not present

## 2022-09-23 DIAGNOSIS — E282 Polycystic ovarian syndrome: Secondary | ICD-10-CM | POA: Diagnosis not present

## 2022-09-23 DIAGNOSIS — Z3200 Encounter for pregnancy test, result unknown: Secondary | ICD-10-CM | POA: Diagnosis not present

## 2022-09-23 DIAGNOSIS — K921 Melena: Secondary | ICD-10-CM | POA: Diagnosis not present

## 2022-09-23 DIAGNOSIS — R110 Nausea: Secondary | ICD-10-CM | POA: Diagnosis not present

## 2022-09-23 DIAGNOSIS — E1165 Type 2 diabetes mellitus with hyperglycemia: Secondary | ICD-10-CM | POA: Diagnosis not present

## 2022-09-23 DIAGNOSIS — R197 Diarrhea, unspecified: Secondary | ICD-10-CM | POA: Diagnosis not present

## 2022-09-23 DIAGNOSIS — E785 Hyperlipidemia, unspecified: Secondary | ICD-10-CM | POA: Diagnosis not present

## 2022-09-23 DIAGNOSIS — Z0130 Encounter for examination of blood pressure without abnormal findings: Secondary | ICD-10-CM | POA: Diagnosis not present

## 2022-09-23 DIAGNOSIS — I10 Essential (primary) hypertension: Secondary | ICD-10-CM | POA: Diagnosis not present

## 2022-09-23 DIAGNOSIS — Z712 Person consulting for explanation of examination or test findings: Secondary | ICD-10-CM | POA: Diagnosis not present

## 2022-09-23 DIAGNOSIS — Z1389 Encounter for screening for other disorder: Secondary | ICD-10-CM | POA: Diagnosis not present

## 2023-05-05 ENCOUNTER — Encounter: Payer: Self-pay | Admitting: Obstetrics and Gynecology
